# Patient Record
Sex: Male | Born: 1985 | Race: Black or African American | Hispanic: No | Marital: Single | Smoking: Never smoker
Health system: Southern US, Community
[De-identification: ages and names within clinical notes are randomized; demographics above are authoritative.]

---
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
## 2007-05-04 ENCOUNTER — Emergency Department (HOSPITAL_COMMUNITY): Admission: EM | Admit: 2007-05-04 | Discharge: 2007-05-04 | Payer: Self-pay | Admitting: Emergency Medicine

## 2007-05-05 ENCOUNTER — Emergency Department (HOSPITAL_COMMUNITY): Admission: EM | Admit: 2007-05-05 | Discharge: 2007-05-05 | Payer: Self-pay | Admitting: Emergency Medicine

## 2009-09-15 ENCOUNTER — Emergency Department (HOSPITAL_COMMUNITY): Admission: EM | Admit: 2009-09-15 | Discharge: 2009-09-16 | Payer: Self-pay | Admitting: Emergency Medicine

## 2020-03-22 ENCOUNTER — Emergency Department (HOSPITAL_COMMUNITY)
Admission: EM | Admit: 2020-03-22 | Discharge: 2020-03-22 | Disposition: A | Discharge status: Home / Self Care | Payer: Medicaid Other | Attending: Emergency Medicine | Admitting: Emergency Medicine

## 2020-03-22 ENCOUNTER — Encounter (HOSPITAL_COMMUNITY): Payer: Self-pay | Admitting: Emergency Medicine

## 2020-03-22 ENCOUNTER — Other Ambulatory Visit: Payer: Self-pay

## 2020-03-22 ENCOUNTER — Ambulatory Visit
Admission: EM | Admit: 2020-03-22 | Discharge: 2020-03-22 | Disposition: A | Discharge status: Home / Self Care | Payer: Medicaid Other

## 2020-03-22 DIAGNOSIS — W260XXA Contact with knife, initial encounter: Secondary | ICD-10-CM | POA: Diagnosis not present

## 2020-03-22 DIAGNOSIS — S61411A Laceration without foreign body of right hand, initial encounter: Secondary | ICD-10-CM | POA: Insufficient documentation

## 2020-03-22 DIAGNOSIS — S6991XA Unspecified injury of right wrist, hand and finger(s), initial encounter: Secondary | ICD-10-CM | POA: Diagnosis present

## 2020-03-22 MED ORDER — BACITRACIN ZINC 500 UNIT/GM EX OINT
1.0000 "application " | TOPICAL_OINTMENT | Freq: Two times a day (BID) | CUTANEOUS | Status: DC
  Filled 2020-03-22: qty 0.9

## 2020-03-22 MED ORDER — LIDOCAINE HCL (PF) 1 % IJ SOLN
5.0000 mL | Freq: Once | INTRAMUSCULAR | Status: AC
  Administered 2020-03-22: 5 mL
  Filled 2020-03-22: qty 30

## 2020-03-22 NOTE — Discharge Instructions (Signed)
You may go to your doctor or the urgent care to have the stitches removed, return to the ER for severe or worsening symptoms or if you cannot see your doctor in follow-up.  The stitches should be removed sometime between 7 and 10 days from now.  If you develop increasing pain fever redness swelling or pus from the wound return to the emergency department immediately.  Please keep the wound clean and dry for 48 hours, apply topical antibiotics and a sterile dressing every 12 hours.

## 2020-03-22 NOTE — ED Triage Notes (Signed)
Laceration to LT hand in between thumb and index finger. No active bleeding   Pt Was seen at [facility] earlier today and and was referred to the ED.

## 2020-03-22 NOTE — ED Provider Notes (Signed)
[facility] EMERGENCY DEPARTMENT Provider Note   CSN: [id number] Arrival date & time: 03/22/20  1308     History Chief Complaint  Patient presents with  . Laceration    Alexander Compton is a 35 y.o. male.  HPI   35 y/o male -presents approximately 16 to 17 hours after having an accidental laceration to his right hand.  He was cutting food when the knife slipped and cut in the first webspace between the thumb and the index finger.  The bleeding was controlled, he comes today after being sent from urgent care for repair and evaluation of this wound.  He is up-to-date on tetanus.  The pain is worse when he tries to move it around, there is no associated numbness.  There has been no significant bleeding today.  History reviewed. No pertinent past medical history.  There are no problems to display for this patient.   History reviewed. No pertinent surgical history.     History reviewed. No pertinent family history.  Social History   Tobacco Use  . Smoking status: Never Smoker  . Smokeless tobacco: Never Used  Vaping Use  . Vaping Use: Never used  Substance Use Topics  . Alcohol use: Not Currently  . Drug use: Not Currently    Home Medications Prior to Admission medications   Not on File    Allergies    Patient has no allergy information on record.  Review of Systems   Review of Systems  Skin: Positive for wound.  Neurological: Negative for numbness.    Physical Exam Updated Vital Signs BP 138/78 (BP Location: Right Arm)   Pulse 92   Temp 98.2 F (36.8 C) (Oral)   Resp 18   Ht 1.778 m (5\' 10" )   Wt 85.3 kg   SpO2 98%   BMI 26.98 kg/m   Physical Exam Constitutional:      General: He is not in acute distress.    Appearance: He is well-developed and well-nourished. He is not diaphoretic.  HENT:     Head: Normocephalic.  Eyes:     General: No scleral icterus.    Conjunctiva/sclera: Conjunctivae normal.  Cardiovascular:     Rate and Rhythm: Normal  rate and regular rhythm.  Pulmonary:     Effort: Pulmonary effort is normal.     Breath sounds: Normal breath sounds.  Musculoskeletal:        General: Tenderness ( ttp over the laceration site ) present. No edema. Normal range of motion.     Comments: Tenderness located over the laceration on the right hand  Skin:    General: Skin is warm and dry.     Comments: Laceration located on right hand The Laceration is linear shaped The depth is into muscle The length is 4 cm  Neurological:     Mental Status: He is alert.     Coordination: Coordination normal.     Comments: Sensation and motor intact     ED Results / Procedures / Treatments   Labs (all labs ordered are listed, but only abnormal results are displayed) Labs Reviewed - No data to display  EKG None  Radiology No results found.  Procedures . Laceration Repair  Date/Time: 03/22/2020 4:09 PM Performed by: 03/24/2020, MD Authorized by: Eber Hong, MD   Consent:    Consent obtained:  Verbal   Consent given by:  Patient   Risks discussed:  Infection, pain, need for additional repair, poor cosmetic result and poor wound healing  Alternatives discussed:  No treatment and delayed treatment Universal protocol:    Patient identity confirmed:  Verbally with patient Anesthesia:    Anesthesia method:  Local infiltration   Local anesthetic:  Lidocaine 1% w/o epi Laceration details:    Location:  Hand   Hand location:  R hand, dorsum   Length (cm):  4   Depth (mm):  6 Pre-procedure details:    Preparation:  Patient was prepped and draped in usual sterile fashion Exploration:    Limited defect created (wound extended): no     Hemostasis achieved with:  Direct pressure   Wound exploration: wound explored through full range of motion and entire depth of wound visualized     Wound extent: no fascia violation noted, no foreign bodies/material noted, no muscle damage noted, no nerve damage noted, no tendon damage  noted, no underlying fracture noted and no vascular damage noted   Treatment:    Area cleansed with:  Betadine   Amount of cleaning:  Standard   Irrigation solution:  Sterile saline   Irrigation volume:  1000   Irrigation method:  Syringe   Debridement:  None   Undermining:  None   Scar revision: no   Skin repair:    Repair method:  Sutures   Suture size:  5-0   Suture material:  Prolene   Suture technique:  Simple interrupted   Number of sutures:  2 Approximation:    Approximation:  Loose Repair type:    Repair type:  Simple Post-procedure details:    Dressing:  Antibiotic ointment and sterile dressing   Procedure completion:  Tolerated well, no immediate complications Comments:           Medications Ordered in ED Medications  lidocaine (PF) (XYLOCAINE) 1 % injection 5 mL (5 mLs Infiltration Given by Other 03/22/20 1600)    ED Course  I have reviewed the triage vital signs and the nursing notes.  Pertinent labs & imaging results that were available during my care of the patient were reviewed by me and considered in my medical decision making (see chart for details).    MDM Rules/Calculators/A&P                          I informed the patient that these types of wounds can become significantly infected given the length of time that it sat.  That being said it was irrigated copiously under anesthetic locally.  I placed 2 sutures, there is loose approximation, the wound will be able to drain appropriately.  He is totally neurovascularly intact.  I encouraged to follow-up with your doctor within 7 to 10 days for suture removal.  He is aware of the indications for return and the signs of infection.  At this time there is no signs of infection, the tissue appears very healthy  Final Clinical Impression(s) / ED Diagnoses Final diagnoses:  None    Rx / DC Orders ED Discharge Orders    None       Eber Hong, MD 03/22/20 1610

## 2020-03-22 NOTE — ED Triage Notes (Signed)
Laceration to LT hand in between thumb and index finger. No active bleeding

## 2020-04-01 ENCOUNTER — Encounter (HOSPITAL_COMMUNITY): Payer: Self-pay | Admitting: Emergency Medicine

## 2020-04-01 ENCOUNTER — Emergency Department (HOSPITAL_COMMUNITY)
Admission: EM | Admit: 2020-04-01 | Discharge: 2020-04-01 | Disposition: A | Discharge status: Home / Self Care | Payer: Medicaid Other | Attending: Emergency Medicine | Admitting: Emergency Medicine

## 2020-04-01 ENCOUNTER — Other Ambulatory Visit: Payer: Self-pay

## 2020-04-01 DIAGNOSIS — S61011D Laceration without foreign body of right thumb without damage to nail, subsequent encounter: Secondary | ICD-10-CM | POA: Insufficient documentation

## 2020-04-01 DIAGNOSIS — Z4802 Encounter for removal of sutures: Secondary | ICD-10-CM

## 2020-04-01 DIAGNOSIS — W260XXD Contact with knife, subsequent encounter: Secondary | ICD-10-CM | POA: Diagnosis not present

## 2020-04-01 NOTE — ED Triage Notes (Signed)
Pt states hes here to have his stitches removed.

## 2020-04-01 NOTE — Discharge Instructions (Addendum)
Return for any problems with your wound.
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
